# Patient Record
Sex: Female | Born: 1960
Health system: Southern US, Community
[De-identification: ages and names within clinical notes are randomized; demographics above are authoritative.]

## PROBLEM LIST (undated history)

## (undated) DIAGNOSIS — H269 Unspecified cataract: Secondary | ICD-10-CM

## (undated) DIAGNOSIS — F32A Depression, unspecified: Secondary | ICD-10-CM

## (undated) DIAGNOSIS — E079 Disorder of thyroid, unspecified: Secondary | ICD-10-CM

## (undated) DIAGNOSIS — E785 Hyperlipidemia, unspecified: Secondary | ICD-10-CM

## (undated) DIAGNOSIS — K589 Irritable bowel syndrome without diarrhea: Secondary | ICD-10-CM

## (undated) DIAGNOSIS — I10 Essential (primary) hypertension: Secondary | ICD-10-CM

## (undated) DIAGNOSIS — C801 Malignant (primary) neoplasm, unspecified: Secondary | ICD-10-CM

## (undated) DIAGNOSIS — F419 Anxiety disorder, unspecified: Secondary | ICD-10-CM

## (undated) HISTORY — DX: Irritable bowel syndrome, unspecified: K58.9

## (undated) HISTORY — DX: Malignant (primary) neoplasm, unspecified: C80.1

## (undated) HISTORY — DX: Unspecified cataract: H26.9

## (undated) HISTORY — DX: Anxiety disorder, unspecified: F41.9

## (undated) HISTORY — DX: Depression, unspecified: F32.A

## (undated) HISTORY — DX: Hyperlipidemia, unspecified: E78.5

## (undated) HISTORY — PX: POLYPECTOMY: SHX149

## (undated) HISTORY — DX: Disorder of thyroid, unspecified: E07.9

## (undated) HISTORY — DX: Essential (primary) hypertension: I10

---

## 1999-11-02 HISTORY — PX: ABDOMINAL HYSTERECTOMY: SHX81

## 2000-10-02 HISTORY — PX: INGUINAL HERNIA REPAIR: SUR1180

## 2001-11-03 ENCOUNTER — Encounter: Payer: Self-pay | Admitting: Internal Medicine

## 2001-11-03 ENCOUNTER — Encounter: Admission: RE | Admit: 2001-11-03 | Discharge: 2001-11-03 | Payer: Self-pay | Admitting: Internal Medicine

## 2001-11-03 ENCOUNTER — Encounter (INDEPENDENT_AMBULATORY_CARE_PROVIDER_SITE_OTHER): Payer: Self-pay | Admitting: *Deleted

## 2001-11-18 ENCOUNTER — Ambulatory Visit (HOSPITAL_COMMUNITY): Admission: RE | Admit: 2001-11-18 | Discharge: 2001-11-18 | Payer: Self-pay | Admitting: Obstetrics and Gynecology

## 2001-11-18 ENCOUNTER — Encounter: Payer: Self-pay | Admitting: Obstetrics and Gynecology

## 2002-07-14 ENCOUNTER — Encounter: Payer: Self-pay | Admitting: Internal Medicine

## 2002-07-14 ENCOUNTER — Encounter: Admission: RE | Admit: 2002-07-14 | Discharge: 2002-07-14 | Payer: Self-pay | Admitting: *Deleted

## 2002-08-31 ENCOUNTER — Other Ambulatory Visit: Admission: RE | Admit: 2002-08-31 | Discharge: 2002-08-31 | Payer: Self-pay | Admitting: Obstetrics and Gynecology

## 2002-09-28 ENCOUNTER — Encounter: Admission: RE | Admit: 2002-09-28 | Discharge: 2002-09-28 | Payer: Self-pay | Admitting: Internal Medicine

## 2002-09-28 ENCOUNTER — Encounter: Payer: Self-pay | Admitting: Internal Medicine

## 2002-11-02 ENCOUNTER — Encounter: Payer: Self-pay | Admitting: Obstetrics and Gynecology

## 2002-11-02 ENCOUNTER — Encounter: Admission: RE | Admit: 2002-11-02 | Discharge: 2002-11-02 | Payer: Self-pay | Admitting: Obstetrics and Gynecology

## 2002-12-13 ENCOUNTER — Encounter: Payer: Self-pay | Admitting: Obstetrics and Gynecology

## 2002-12-13 ENCOUNTER — Encounter: Admission: RE | Admit: 2002-12-13 | Discharge: 2002-12-13 | Payer: Self-pay | Admitting: Obstetrics and Gynecology

## 2003-08-10 ENCOUNTER — Ambulatory Visit (HOSPITAL_COMMUNITY): Admission: RE | Admit: 2003-08-10 | Discharge: 2003-08-10 | Payer: Self-pay | Admitting: Obstetrics and Gynecology

## 2003-08-10 ENCOUNTER — Encounter: Payer: Self-pay | Admitting: Obstetrics and Gynecology

## 2004-01-09 ENCOUNTER — Encounter: Admission: RE | Admit: 2004-01-09 | Discharge: 2004-01-09 | Payer: Self-pay | Admitting: Obstetrics and Gynecology

## 2005-01-16 ENCOUNTER — Encounter: Admission: RE | Admit: 2005-01-16 | Discharge: 2005-01-16 | Payer: Self-pay | Admitting: Obstetrics and Gynecology

## 2006-02-12 ENCOUNTER — Encounter: Admission: RE | Admit: 2006-02-12 | Discharge: 2006-02-12 | Payer: Self-pay | Admitting: Obstetrics and Gynecology

## 2007-03-11 ENCOUNTER — Encounter: Admission: RE | Admit: 2007-03-11 | Discharge: 2007-03-11 | Payer: Self-pay | Admitting: Obstetrics and Gynecology

## 2007-07-24 ENCOUNTER — Ambulatory Visit: Payer: Self-pay | Admitting: Internal Medicine

## 2007-08-10 ENCOUNTER — Ambulatory Visit: Payer: Self-pay | Admitting: Internal Medicine

## 2007-08-10 ENCOUNTER — Encounter: Payer: Self-pay | Admitting: Internal Medicine

## 2007-12-31 ENCOUNTER — Ambulatory Visit (HOSPITAL_COMMUNITY): Admission: RE | Admit: 2007-12-31 | Discharge: 2007-12-31 | Payer: Self-pay | Admitting: Chiropractic Medicine

## 2009-11-09 ENCOUNTER — Encounter (INDEPENDENT_AMBULATORY_CARE_PROVIDER_SITE_OTHER): Payer: Self-pay | Admitting: *Deleted

## 2009-12-18 ENCOUNTER — Ambulatory Visit: Payer: Self-pay | Admitting: Internal Medicine

## 2009-12-18 DIAGNOSIS — Z860100 Personal history of colon polyps, unspecified: Secondary | ICD-10-CM | POA: Insufficient documentation

## 2009-12-18 DIAGNOSIS — K5900 Constipation, unspecified: Secondary | ICD-10-CM | POA: Insufficient documentation

## 2009-12-19 LAB — CONVERTED CEMR LAB
AST: 17 units/L (ref 0–37)
Albumin: 4.2 g/dL (ref 3.5–5.2)
Alkaline Phosphatase: 48 units/L (ref 39–117)
Basophils Absolute: 0 10*3/uL (ref 0.0–0.1)
Bilirubin, Direct: 0.1 mg/dL (ref 0.0–0.3)
CO2: 28 meq/L (ref 19–32)
Calcium: 9.8 mg/dL (ref 8.4–10.5)
Eosinophils Relative: 2 % (ref 0.0–5.0)
GFR calc non Af Amer: 81.6 mL/min (ref >60)
Glucose, Bld: 62 mg/dL — ABNORMAL LOW (ref 70–99)
Lymphocytes Relative: 44.5 % (ref 12.0–46.0)
Monocytes Relative: 9.2 % (ref 3.0–12.0)
Neutrophils Relative %: 43.5 % (ref 43.0–77.0)
Platelets: 292 10*3/uL (ref 150.0–400.0)
Potassium: 4.8 meq/L (ref 3.5–5.1)
RDW: 12.2 % (ref 11.5–14.6)
Sodium: 145 meq/L (ref 135–145)
Total Protein: 6.9 g/dL (ref 6.0–8.3)
WBC: 4.5 10*3/uL (ref 4.5–10.5)

## 2010-09-03 ENCOUNTER — Encounter: Admission: RE | Admit: 2010-09-03 | Discharge: 2010-09-03 | Payer: Self-pay | Admitting: Obstetrics and Gynecology

## 2011-01-01 NOTE — Assessment & Plan Note (Signed)
Summary: constipation     History of Present Illness Visit Type: Initial Visit Primary GI MD: Yancey Flemings MD Primary Provider: none Chief Complaint: severe constipation History of Present Illness:   50 year old white female for history ofanxiety/depression, kidney stones, skin cancer, and adenomatous colon polyps. She presented today for evaluation of chronic constipation. Patient reports a many year history of constipation. She has tried increased fiber and water intake without results. Currently, she takes one Ducolax tablet twice weekly. This results in a good bowel movement without side effects. She is concerned about using laxatives regularly. She did undergo complete colonoscopy August 10, 2007 because of a family history of colon cancer in a parent at age 81. The examination was complete to the cecum with excellent preparation. A diminutive A. ascending colon polyp adenoma was removed. Follow up in 5 years recommended. The patient denies abdominal pain, rectal bleeding, weight loss.. She feels that she was having some of these difficulties with her bowels at the time of her index colonoscopy.. Her only new medication is Prozac. She does not notice worsening in her bowels since starting the medication.   GI Review of Systems    Reports acid reflux, belching, and  bloating.      Denies abdominal pain, chest pain, dysphagia with liquids, dysphagia with solids, heartburn, loss of appetite, nausea, vomiting, vomiting blood, weight loss, and  weight gain.      Reports change in bowel habits and  constipation.     Denies anal fissure, black tarry stools, diarrhea, diverticulosis, fecal incontinence, heme positive stool, hemorrhoids, irritable bowel syndrome, jaundice, light color stool, liver problems, rectal bleeding, and  rectal pain.    Current Medications (verified): 1)  Budeprion Sr 150 Mg Xr12h-Tab (Bupropion Hcl) .Marland Kitchen.. 1 Tablet By Mouth Once Daily 2)  Premarin 0.625 Mg Tabs (Estrogens  Conjugated) .Marland Kitchen.. 1 Tablet By Mouth Once Daily 3)  Alprazolam 0.5 Mg Tabs (Alprazolam) .Marland Kitchen.. 1 By Mouth As Needed Anxiety 4)  Fluoxetine Hcl 10 Mg Caps (Fluoxetine Hcl) .Marland Kitchen.. 1-2 Capsules By Mouth Once Daily  Allergies (verified): 1)  ! Codeine  Past History:  Past Medical History: Colon  Polyps-Tubular Adenoma Family History of Colon Cancer Inguinal Hernia Sleep Apnea Anxiety Disorder Depression Skin Cancer Kidney Stones  Past Surgical History: Reviewed history from 12/14/2009 and no changes required. Hysterectomy Lt. Inguinal Hernia Repair Skin Cancer Removal Laproscopy  Family History: Reviewed history and no changes required. Family History of Colon Cancer: Family History of Colon Polyps:  Social History: Reviewed history and no changes required. 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  Alcohol Use - no Daily Caffeine Use 2 per day Smoking Status:  quit  Review of Systems       anxiety, depression. All other systems reviewed and reported as negative  Vital Signs:  Patient profile:   50 year old female Height:      63 inches Weight:      115.4 pounds BMI:     20.52 Pulse rate:   84 / minute Pulse rhythm:   regular BP sitting:   118 / 78  (left arm)  Vitals Entered By: Milford Cage NCMA (December 18, 2009 9:15 AM)  Physical Exam  General:  Well developed, well nourished, no acute distress. Head:  Normocephalic and atraumatic. Eyes:  PERRLA, no icterus. Ears:  Normal auditory acuity. Nose:  No deformity, discharge,  or lesions. Mouth:  No deformity or lesions, dentition normal. Neck:  Supple; no masses  or thyromegaly. Lungs:  Clear throughout to auscultation. Heart:  Regular rate and rhythm; no murmurs, rubs,  or bruits. Abdomen:  Soft, nontender and nondistended. No masses, hepatosplenomegaly or hernias noted. Normal bowel sounds. Msk:  Symmetrical with no gross deformities. Normal posture. Pulses:  Normal pulses  noted. Extremities:  No clubbing, cyanosis, edema or deformities noted. Neurologic:  Alert and  oriented x4;  grossly normal neurologically. Skin:  Intact without significant lesions or rashes. Cervical Nodes:  No significant cervical adenopathy. Psych:  Alert and cooperative. Normal mood and affect.   Impression & Recommendations:  Problem # 1:  CONSTIPATION (ICD-564.00) Chronic constipation, likely functional. Essentially normal colonoscopy in 2008 as described. Satisfactory results with one Ducolax tablet twice weekly.  Plan: #1. Screening laboratories including TSH, calcium, and CBC to assess for other possible causes for constipation #2. Okay to use Ducolax for constipation #3. Follow up as needed for this problem. Resume general medical care with Dr. Senaida Ores  Problem # 2:  COLONIC POLYPS, HX OF (ICD-V12.72) history of diminutive adenoma September 2008. Due for followup colonoscopy around September 2013. Keep this anniversary date  Problem # 3:  FAMILY HX COLON CANCER (ICD-V16.0) parent at age 64  Other Orders: TLB-CBC Platelet - w/Differential (85025-CBCD) TLB-BMP (Basic Metabolic Panel-BMET) (80048-METABOL) TLB-Hepatic/Liver Function Pnl (80076-HEPATIC) TLB-TSH (Thyroid Stimulating Hormone) (84443-TSH)  Patient Instructions: 1)  labs ordered for patient to have drawn today. 2)  The medication list was reviewed and reconciled.  All changed / newly prescribed medications were explained.  A complete medication list was provided to the patient / caregiver. 3)  Copy: Dr. Huel Cote

## 2011-01-01 NOTE — Procedures (Signed)
Summary: Colonoscopy   Colonoscopy  Procedure date:  08/10/2007  Findings:      Location:  [facility].  Tubular Adenoma Colon Polyps  Comments:      Repeat colonoscopy in 5 years.   Procedures Next Due Date:    Colonoscopy: 08/2012 Patient Name: Johnnae, Impastato MRN:  Procedure Procedures: Colonoscopy CPT: 62952.    with polypectomy. CPT: A3573898.  Personnel: Endoscopist: Wilhemina Bonito. Marina Goodell, MD.  Referred By: Huel Cote, MD.  Exam Location: Exam performed in Outpatient Clinic. Outpatient  Patient Consent: Procedure, Alternatives, Risks and Benefits discussed, consent obtained, from patient. Consent was obtained by the RN.  Indications  Increased Risk Screening: For family history of colorectal neoplasia, in  parent age at onset: 31. grandparent Family History of Polyps.  History  Current Medications: Patient is not currently taking Coumadin.  Pre-Exam Physical: Performed Aug 10, 2007. Cardio-pulmonary exam, Rectal exam, HEENT exam , Abdominal exam, Mental status exam WNL.  Comments: Pt. history reviewed/updated, physical exam performed prior to initiation of sedation?yes Exam Exam: Extent of exam reached: Cecum, extent intended: Cecum.  The cecum was identified by appendiceal orifice and IC valve. Patient position: on left side. Time to Cecum: 00:08:30. Time for Withdrawl: 00:09:41. Colon retroflexion performed. Images taken. ASA Classification: I. Tolerance: good.  Monitoring: Pulse and BP monitoring, Oximetry used. Supplemental O2 given.  Colon Prep Used Miralax for colon prep. Prep results: excellent.  Sedation Meds: Patient assessed and found to be appropriate for moderate (conscious) sedation. Fentanyl 100 mcg. given IV. Versed 12 given IV.  Findings POLYP: Ascending Colon, Maximum size: 1 mm. Procedure:  snare without cautery, removed, retrieved, Polyp sent to pathology. ICD9: Colon Polyps: 211.3.  NORMAL EXAM: Cecum to Rectum.    Assessment  Diagnoses: 211.3: Colon Polyps.   Events  Unplanned Interventions: No intervention was required.  Unplanned Events: There were no complications. Plans Disposition: After procedure patient sent to recovery. After recovery patient sent home.  Scheduling/Referral: Colonoscopy, to Wilhemina Bonito. Marina Goodell, MD, in 5 years,    This report was created from the original endoscopy report, which was reviewed and signed by the above listed endoscopist.   cc:  Huel Cote, MD      The Patient

## 2011-09-23 ENCOUNTER — Other Ambulatory Visit: Payer: Self-pay | Admitting: Obstetrics and Gynecology

## 2011-09-23 DIAGNOSIS — Z1231 Encounter for screening mammogram for malignant neoplasm of breast: Secondary | ICD-10-CM

## 2011-10-03 ENCOUNTER — Ambulatory Visit
Admission: RE | Admit: 2011-10-03 | Discharge: 2011-10-03 | Disposition: A | Discharge status: Home / Self Care | Payer: PRIVATE HEALTH INSURANCE | Source: Ambulatory Visit | Attending: Obstetrics and Gynecology | Admitting: Obstetrics and Gynecology

## 2011-10-03 DIAGNOSIS — Z1231 Encounter for screening mammogram for malignant neoplasm of breast: Secondary | ICD-10-CM

## 2012-01-31 HISTORY — PX: DENTAL SURGERY: SHX609

## 2012-09-03 ENCOUNTER — Other Ambulatory Visit: Payer: Self-pay | Admitting: Obstetrics and Gynecology

## 2012-09-03 DIAGNOSIS — Z1231 Encounter for screening mammogram for malignant neoplasm of breast: Secondary | ICD-10-CM

## 2012-09-04 ENCOUNTER — Encounter: Payer: Self-pay | Admitting: Internal Medicine

## 2012-10-09 ENCOUNTER — Ambulatory Visit
Admission: RE | Admit: 2012-10-09 | Discharge: 2012-10-09 | Disposition: A | Discharge status: Home / Self Care | Payer: PRIVATE HEALTH INSURANCE | Source: Ambulatory Visit | Attending: Obstetrics and Gynecology | Admitting: Obstetrics and Gynecology

## 2012-10-09 DIAGNOSIS — Z1231 Encounter for screening mammogram for malignant neoplasm of breast: Secondary | ICD-10-CM

## 2012-11-27 ENCOUNTER — Encounter: Payer: Self-pay | Admitting: Internal Medicine

## 2012-12-25 ENCOUNTER — Ambulatory Visit (AMBULATORY_SURGERY_CENTER): Payer: PRIVATE HEALTH INSURANCE | Admitting: *Deleted

## 2012-12-25 VITALS — Ht 63.0 in | Wt 120.0 lb

## 2012-12-25 DIAGNOSIS — Z1211 Encounter for screening for malignant neoplasm of colon: Secondary | ICD-10-CM

## 2012-12-25 MED ORDER — MOVIPREP 100 G PO SOLR: ORAL | Status: DC

## 2013-01-08 ENCOUNTER — Ambulatory Visit (AMBULATORY_SURGERY_CENTER): Payer: PRIVATE HEALTH INSURANCE | Admitting: Internal Medicine

## 2013-01-08 ENCOUNTER — Encounter: Payer: Self-pay | Admitting: Internal Medicine

## 2013-01-08 VITALS — BP 114/98 | HR 89 | Temp 96.9°F | Resp 15 | Ht 63.0 in | Wt 120.0 lb

## 2013-01-08 DIAGNOSIS — Z86010 Personal history of colonic polyps: Secondary | ICD-10-CM

## 2013-01-08 DIAGNOSIS — D126 Benign neoplasm of colon, unspecified: Secondary | ICD-10-CM

## 2013-01-08 DIAGNOSIS — Z1211 Encounter for screening for malignant neoplasm of colon: Secondary | ICD-10-CM

## 2013-01-08 DIAGNOSIS — Z800 Family history of malignant neoplasm of digestive organs: Secondary | ICD-10-CM

## 2013-01-08 HISTORY — PX: COLONOSCOPY: SHX174

## 2013-01-08 MED ORDER — SODIUM CHLORIDE 0.9 % IV SOLN: 500.0000 mL | INTRAVENOUS | Status: DC

## 2013-01-08 NOTE — Progress Notes (Signed)
Patient did not experience any of the following events: a burn prior to discharge; a fall within the facility; wrong site/side/patient/procedure/implant event; or a hospital transfer or hospital admission upon discharge from the facility. (G8907) Patient did not have preoperative order for IV antibiotic SSI prophylaxis. (G8918)  

## 2013-01-08 NOTE — Progress Notes (Signed)
Called to room to assist during endoscopic procedure.  Patient ID and intended procedure confirmed with present staff. Received instructions for my participation in the procedure from the performing physician.Called to room to assist during endoscopic procedure.  Patient ID and intended procedure confirmed with present staff. Received instructions for my participation in the procedure from the performing physician. 

## 2013-01-08 NOTE — Op Note (Signed)
[facility] [address] [place], [address]   COLONOSCOPY PROCEDURE REPORT  PATIENT: Brittney Palmer  MR#: [id number] BIRTHDATE: 03/01/1961 , 51  yrs. old GENDER: Female ENDOSCOPIST: Roxy Cedar, MD REFERRED XB:[id number] Program Recall PROCEDURE DATE:  01/08/2013 PROCEDURE:   Colonoscopy with snare polypectomy    x 4 ASA CLASS:   Class I INDICATIONS:Patient's personal history of adenomatous colon polyps (08-2007 w/ TA), Patient's immediate family history of colon cancer, and Patient's family history of colon cancer, distant relatives. MEDICATIONS: MAC sedation, administered by CRNA and propofol (Diprivan) 200mg  IV  DESCRIPTION OF PROCEDURE:   After the risks benefits and alternatives of the procedure were thoroughly explained, informed consent was obtained.  A digital rectal exam revealed no abnormalities of the rectum.   The LB [id number]  endoscope was introduced through the anus and advanced to the cecum, which was identified by both the appendix and ileocecal valve. No adverse events experienced.   The quality of the prep was excellent, using MoviPrep  The instrument was then slowly withdrawn as the colon was fully examined.      COLON FINDINGS: Four polyps measuring 5 mm in size were found in the ascending colon, transverse colon, and sigmoid colon (2).  A polypectomy was performed with a cold snare.  The resection was complete and the polyp tissue was completely retrieved.   The colon mucosa was otherwise normal.  Retroflexed views revealed no abnormalities. The time to cecum=5 minutes 07 seconds.  Withdrawal time=10 minutes 51 seconds.  The scope was withdrawn and the procedure completed. COMPLICATIONS: There were no complications.  ENDOSCOPIC IMPRESSION: 1.   Four polyps measuring 5 mm in size were found in the ascending colon, transverse colon, and sigmoid colon; polypectomy was performed with a cold snare 2.   The colon mucosa  was otherwise normal  RECOMMENDATIONS: 1. Repeat Colonoscopy in 3 years.   eSigned:  Roxy Cedar, MD 01/08/2013 8:46 AM   cc: Huel Cote, MD, Maurice Small, MD, and The Patient   PATIENT NAME:  Brittney Palmer, Brittney Palmer MR#: [id number]

## 2013-01-08 NOTE — Patient Instructions (Signed)
YOU HAD AN ENDOSCOPIC PROCEDURE TODAY AT THE [facility]: Refer to the procedure report that was given to you for any specific questions about what was found during the examination.  If the procedure report does not answer your questions, please call your gastroenterologist to clarify.  If you requested that your care partner not be given the details of your procedure findings, then the procedure report has been included in a sealed envelope for you to review at your convenience later.  YOU SHOULD EXPECT: Some feelings of bloating in the abdomen. Passage of more gas than usual.  Walking can help get rid of the air that was put into your GI tract during the procedure and reduce the bloating. If you had a lower endoscopy (such as a colonoscopy or flexible sigmoidoscopy) you may notice spotting of blood in your stool or on the toilet paper. If you underwent a bowel prep for your procedure, then you may not have a normal bowel movement for a few days.  DIET: Your first meal following the procedure should be a light meal and then it is ok to progress to your normal diet.  A half-sandwich or bowl of soup is an example of a good first meal.  Heavy or fried foods are harder to digest and may make you feel nauseous or bloated.  Likewise meals heavy in dairy and vegetables can cause extra gas to form and this can also increase the bloating.  Drink plenty of fluids but you should avoid alcoholic beverages for 24 hours.  ACTIVITY: Your care partner should take you home directly after the procedure.  You should plan to take it easy, moving slowly for the rest of the day.  You can resume normal activity the day after the procedure however you should NOT DRIVE or use heavy machinery for 24 hours (because of the sedation medicines used during the test).    SYMPTOMS TO REPORT IMMEDIATELY: A gastroenterologist can be reached at any hour.  During normal business hours, 8:30 AM to 5:00 PM Monday through Friday,  call [phone].  After hours and on weekends, please call the GI answering service at [phone] who will take a message and have the physician on call contact you.   Following lower endoscopy (colonoscopy or flexible sigmoidoscopy):  Excessive amounts of blood in the stool  Significant tenderness or worsening of abdominal pains  Swelling of the abdomen that is new, acute  Fever of 100F or higher    FOLLOW UP: If any biopsies were taken you will be contacted by phone or by letter within the next 1-3 weeks.  Call your gastroenterologist if you have not heard about the biopsies in 3 weeks.  Our staff will call the home number listed on your records the next business day following your procedure to check on you and address any questions or concerns that you may have at that time regarding the information given to you following your procedure. This is a courtesy call and so if there is no answer at the home number and we have not heard from you through the emergency physician on call, we will assume that you have returned to your regular daily activities without incident.  SIGNATURES/CONFIDENTIALITY: You and/or your care partner have signed paperwork which will be entered into your electronic medical record.  These signatures attest to the fact that that the information above on your After Visit Summary has been reviewed and is understood.  Full responsibility of the confidentiality   of this discharge information lies with you and/or your care-partner.     

## 2013-01-08 NOTE — Progress Notes (Addendum)
Patient complained of abdominal discomfort on admission to recovery.  Patient very emotional and crying when speaking with husband. Patient position changed frequently and levsin given per standing orders. Relief obtained and large amounts of flatus passed. No longer emotional and is comfortable with pain level less than 4.

## 2013-01-11 ENCOUNTER — Telehealth: Payer: Self-pay | Admitting: *Deleted

## 2013-01-11 NOTE — Telephone Encounter (Signed)
NO ANSWER, MESSAGE LEFT FOR THE PATIENT. 

## 2013-01-12 ENCOUNTER — Encounter: Payer: Self-pay | Admitting: Internal Medicine

## 2013-10-05 ENCOUNTER — Other Ambulatory Visit: Payer: Self-pay

## 2013-10-05 DIAGNOSIS — Z1231 Encounter for screening mammogram for malignant neoplasm of breast: Secondary | ICD-10-CM

## 2013-10-05 DIAGNOSIS — Z780 Asymptomatic menopausal state: Secondary | ICD-10-CM

## 2013-11-05 ENCOUNTER — Other Ambulatory Visit: Payer: Self-pay | Admitting: Obstetrics and Gynecology

## 2013-11-05 DIAGNOSIS — Z780 Asymptomatic menopausal state: Secondary | ICD-10-CM

## 2013-11-10 ENCOUNTER — Ambulatory Visit: Payer: PRIVATE HEALTH INSURANCE

## 2013-11-10 ENCOUNTER — Other Ambulatory Visit: Payer: PRIVATE HEALTH INSURANCE

## 2013-12-03 ENCOUNTER — Other Ambulatory Visit: Payer: Self-pay | Admitting: Family Medicine

## 2013-12-14 ENCOUNTER — Ambulatory Visit: Payer: PRIVATE HEALTH INSURANCE

## 2013-12-14 ENCOUNTER — Other Ambulatory Visit: Payer: PRIVATE HEALTH INSURANCE

## 2013-12-17 ENCOUNTER — Other Ambulatory Visit: Payer: Self-pay | Admitting: Family Medicine

## 2013-12-17 ENCOUNTER — Ambulatory Visit
Admission: RE | Admit: 2013-12-17 | Discharge: 2013-12-17 | Disposition: A | Discharge status: Home / Self Care | Payer: PRIVATE HEALTH INSURANCE | Source: Ambulatory Visit | Attending: Family Medicine | Admitting: Family Medicine

## 2013-12-17 DIAGNOSIS — T1490XA Injury, unspecified, initial encounter: Secondary | ICD-10-CM

## 2014-01-25 ENCOUNTER — Ambulatory Visit
Admission: RE | Admit: 2014-01-25 | Discharge: 2014-01-25 | Disposition: A | Discharge status: Home / Self Care | Payer: PRIVATE HEALTH INSURANCE | Source: Ambulatory Visit | Attending: Obstetrics and Gynecology | Admitting: Obstetrics and Gynecology

## 2014-01-25 ENCOUNTER — Ambulatory Visit
Admission: RE | Admit: 2014-01-25 | Discharge: 2014-01-25 | Disposition: A | Discharge status: Home / Self Care | Payer: PRIVATE HEALTH INSURANCE | Source: Ambulatory Visit

## 2014-01-25 DIAGNOSIS — Z780 Asymptomatic menopausal state: Secondary | ICD-10-CM

## 2014-01-25 DIAGNOSIS — Z1231 Encounter for screening mammogram for malignant neoplasm of breast: Secondary | ICD-10-CM

## 2016-02-06 ENCOUNTER — Encounter: Payer: Self-pay | Admitting: Internal Medicine

## 2016-03-21 ENCOUNTER — Encounter: Payer: Self-pay | Admitting: Family Medicine

## 2016-09-06 DIAGNOSIS — F411 Generalized anxiety disorder: Secondary | ICD-10-CM | POA: Diagnosis not present

## 2017-07-28 DIAGNOSIS — F411 Generalized anxiety disorder: Secondary | ICD-10-CM | POA: Diagnosis not present

## 2017-07-28 DIAGNOSIS — I10 Essential (primary) hypertension: Secondary | ICD-10-CM | POA: Diagnosis not present

## 2017-07-28 DIAGNOSIS — Z85828 Personal history of other malignant neoplasm of skin: Secondary | ICD-10-CM | POA: Diagnosis not present

## 2017-07-28 DIAGNOSIS — Z87891 Personal history of nicotine dependence: Secondary | ICD-10-CM | POA: Diagnosis not present

## 2017-09-16 DIAGNOSIS — R350 Frequency of micturition: Secondary | ICD-10-CM | POA: Diagnosis not present

## 2017-09-16 DIAGNOSIS — Z6824 Body mass index (BMI) 24.0-24.9, adult: Secondary | ICD-10-CM | POA: Diagnosis not present

## 2017-09-16 DIAGNOSIS — Z1331 Encounter for screening for depression: Secondary | ICD-10-CM | POA: Diagnosis not present

## 2017-09-16 DIAGNOSIS — N390 Urinary tract infection, site not specified: Secondary | ICD-10-CM | POA: Diagnosis not present

## 2017-09-29 DIAGNOSIS — R399 Unspecified symptoms and signs involving the genitourinary system: Secondary | ICD-10-CM | POA: Diagnosis not present

## 2017-09-29 DIAGNOSIS — N390 Urinary tract infection, site not specified: Secondary | ICD-10-CM | POA: Diagnosis not present

## 2017-09-29 DIAGNOSIS — R8290 Unspecified abnormal findings in urine: Secondary | ICD-10-CM | POA: Diagnosis not present

## 2017-09-30 DIAGNOSIS — R8290 Unspecified abnormal findings in urine: Secondary | ICD-10-CM | POA: Diagnosis not present

## 2017-10-10 DIAGNOSIS — Z113 Encounter for screening for infections with a predominantly sexual mode of transmission: Secondary | ICD-10-CM | POA: Diagnosis not present

## 2017-10-10 DIAGNOSIS — N390 Urinary tract infection, site not specified: Secondary | ICD-10-CM | POA: Diagnosis not present

## 2017-10-10 DIAGNOSIS — R8290 Unspecified abnormal findings in urine: Secondary | ICD-10-CM | POA: Diagnosis not present

## 2018-03-02 DIAGNOSIS — I10 Essential (primary) hypertension: Secondary | ICD-10-CM | POA: Diagnosis not present

## 2018-03-02 DIAGNOSIS — F39 Unspecified mood [affective] disorder: Secondary | ICD-10-CM | POA: Diagnosis not present

## 2018-03-02 DIAGNOSIS — N951 Menopausal and female climacteric states: Secondary | ICD-10-CM | POA: Diagnosis not present

## 2018-04-13 DIAGNOSIS — I10 Essential (primary) hypertension: Secondary | ICD-10-CM | POA: Diagnosis not present

## 2018-07-15 DIAGNOSIS — K5909 Other constipation: Secondary | ICD-10-CM | POA: Diagnosis not present

## 2018-07-15 DIAGNOSIS — Z800 Family history of malignant neoplasm of digestive organs: Secondary | ICD-10-CM | POA: Diagnosis not present

## 2018-09-15 ENCOUNTER — Encounter: Payer: Self-pay | Admitting: Internal Medicine

## 2018-09-15 ENCOUNTER — Ambulatory Visit: Payer: BLUE CROSS/BLUE SHIELD | Admitting: Internal Medicine

## 2018-09-15 VITALS — BP 110/78 | HR 103 | Ht 63.0 in | Wt 141.0 lb

## 2018-09-15 DIAGNOSIS — K5900 Constipation, unspecified: Secondary | ICD-10-CM | POA: Diagnosis not present

## 2018-09-15 DIAGNOSIS — Z800 Family history of malignant neoplasm of digestive organs: Secondary | ICD-10-CM | POA: Diagnosis not present

## 2018-09-15 DIAGNOSIS — Z86010 Personal history of colonic polyps: Secondary | ICD-10-CM

## 2018-09-15 NOTE — Patient Instructions (Signed)
Please call back when you are ready to schedule a colonoscopy 

## 2018-09-15 NOTE — Progress Notes (Signed)
HISTORY OF PRESENT ILLNESS:  Brittney Palmer is a 57 y.o. female with a family history of colon cancer and a history of multiple adenomatous colon polyps for which she is undergone prior colonoscopy in this office.  Patient also has a history of chronic constipation for which she saw her PCP August 2019.  I have reviewed that office note.  MiraLAX 1 dose daily was recommended.  The patient has been compliant and reports marked improvement in her constipation.  She underwent colonoscopy in 2008 with tubular adenoma.  Her last colonoscopy was February 2014.  For adenomatous colon polyps for which follow-up in 3 years was recommended.  She did receive a recall letter but did not act on that letter recommending follow-up in 2017.  Her GI review of systems at this time is otherwise negative.  Review of outside data and x-rays does not show any relevant abnormalities.  REVIEW OF SYSTEMS:  All non-GI ROS negative unless otherwise stated in the HPI except for anxiety  Past Medical History:  Diagnosis Date  . Anxiety   . Cancer (HCC)    squamous cell  . Hypertension   . IBS (irritable bowel syndrome)     Past Surgical History:  Procedure Laterality Date  . ABDOMINAL HYSTERECTOMY  11/1999  . DENTAL SURGERY  01/2012   implant  . INGUINAL HERNIA REPAIR  10/2000   left    Social History Brittney Palmer  reports that she has quit smoking. She has never used smokeless tobacco. She reports that she drinks about 2.0 standard drinks of alcohol per week. She reports that she does not use drugs.  family history includes Cirrhosis in her paternal uncle; Colon cancer in her father and paternal grandmother.  Allergies  Allergen Reactions  . Codeine Nausea And Vomiting       PHYSICAL EXAMINATION: Vital signs: BP 110/78   Pulse (!) 103   Ht 5\' 3"  (1.6 m)   Wt 141 lb (64 kg)   BMI 24.98 kg/m   Constitutional: generally well-appearing, no acute distress Psychiatric: alert and oriented x3,  cooperative Eyes: extraocular movements intact, anicteric, conjunctiva pink Mouth: oral pharynx moist, no lesions Neck: supple no lymphadenopathy Cardiovascular: heart regular rate and rhythm, no murmur Lungs: clear to auscultation bilaterally Abdomen: soft, nontender, nondistended, no obvious ascites, no peritoneal signs, normal bowel sounds, no organomegaly Rectal: Deferred until colonoscopy Extremities: no clubbing, cyanosis, or lower extremity edema bilaterally Skin: no lesions on visible extremities Neuro: No focal deficits.  Cranial nerves intact  ASSESSMENT:  1.  Functional constipation.  Improved with MiraLAX 2.  Personal history of multiple adenomatous colon polyps.  Overdue for surveillance 3.  Family history of colon cancer   PLAN:  1.  Continue MiraLAX.  We discussed the proper way to titrate to achieve desired effect 2.  Schedule colonoscopy for surveillance.The nature of the procedure, as well as the risks, benefits, and alternatives were carefully and thoroughly reviewed with the patient. Ample time for discussion and questions allowed. The patient understood, was satisfied, and agreed to proceed.  The patient states that she wishes not to schedule at this time but will contact the office in the future after reviewing the finances associated with moving forward.  She does understand the risk of colon cancer without complying with surveillance guidelines.  She can see a previsit nurse directly and does not need to schedule an office visit with me.  She understands.

## 2018-11-16 DIAGNOSIS — N951 Menopausal and female climacteric states: Secondary | ICD-10-CM | POA: Diagnosis not present

## 2018-11-16 DIAGNOSIS — F39 Unspecified mood [affective] disorder: Secondary | ICD-10-CM | POA: Diagnosis not present

## 2018-11-16 DIAGNOSIS — I10 Essential (primary) hypertension: Secondary | ICD-10-CM | POA: Diagnosis not present

## 2018-11-16 DIAGNOSIS — Z0000 Encounter for general adult medical examination without abnormal findings: Secondary | ICD-10-CM | POA: Diagnosis not present

## 2018-12-28 DIAGNOSIS — M9903 Segmental and somatic dysfunction of lumbar region: Secondary | ICD-10-CM | POA: Diagnosis not present

## 2018-12-28 DIAGNOSIS — M9905 Segmental and somatic dysfunction of pelvic region: Secondary | ICD-10-CM | POA: Diagnosis not present

## 2018-12-28 DIAGNOSIS — M25551 Pain in right hip: Secondary | ICD-10-CM | POA: Diagnosis not present

## 2018-12-28 DIAGNOSIS — M5441 Lumbago with sciatica, right side: Secondary | ICD-10-CM | POA: Diagnosis not present

## 2018-12-29 DIAGNOSIS — Z713 Dietary counseling and surveillance: Secondary | ICD-10-CM | POA: Diagnosis not present

## 2019-01-04 DIAGNOSIS — M9905 Segmental and somatic dysfunction of pelvic region: Secondary | ICD-10-CM | POA: Diagnosis not present

## 2019-01-04 DIAGNOSIS — M5441 Lumbago with sciatica, right side: Secondary | ICD-10-CM | POA: Diagnosis not present

## 2019-01-04 DIAGNOSIS — M9903 Segmental and somatic dysfunction of lumbar region: Secondary | ICD-10-CM | POA: Diagnosis not present

## 2019-01-04 DIAGNOSIS — M25551 Pain in right hip: Secondary | ICD-10-CM | POA: Diagnosis not present

## 2019-01-07 DIAGNOSIS — M9903 Segmental and somatic dysfunction of lumbar region: Secondary | ICD-10-CM | POA: Diagnosis not present

## 2019-01-07 DIAGNOSIS — M9905 Segmental and somatic dysfunction of pelvic region: Secondary | ICD-10-CM | POA: Diagnosis not present

## 2019-01-07 DIAGNOSIS — M25551 Pain in right hip: Secondary | ICD-10-CM | POA: Diagnosis not present

## 2019-01-07 DIAGNOSIS — M5441 Lumbago with sciatica, right side: Secondary | ICD-10-CM | POA: Diagnosis not present

## 2019-01-11 DIAGNOSIS — M5441 Lumbago with sciatica, right side: Secondary | ICD-10-CM | POA: Diagnosis not present

## 2019-01-11 DIAGNOSIS — M9903 Segmental and somatic dysfunction of lumbar region: Secondary | ICD-10-CM | POA: Diagnosis not present

## 2019-01-11 DIAGNOSIS — M9905 Segmental and somatic dysfunction of pelvic region: Secondary | ICD-10-CM | POA: Diagnosis not present

## 2019-01-11 DIAGNOSIS — M25551 Pain in right hip: Secondary | ICD-10-CM | POA: Diagnosis not present

## 2019-02-25 DIAGNOSIS — Z713 Dietary counseling and surveillance: Secondary | ICD-10-CM | POA: Diagnosis not present

## 2019-07-19 DIAGNOSIS — F341 Dysthymic disorder: Secondary | ICD-10-CM | POA: Diagnosis not present

## 2019-07-19 DIAGNOSIS — I10 Essential (primary) hypertension: Secondary | ICD-10-CM | POA: Diagnosis not present

## 2019-07-23 DIAGNOSIS — Z1159 Encounter for screening for other viral diseases: Secondary | ICD-10-CM | POA: Diagnosis not present

## 2019-07-23 DIAGNOSIS — Z114 Encounter for screening for human immunodeficiency virus [HIV]: Secondary | ICD-10-CM | POA: Diagnosis not present

## 2019-07-23 DIAGNOSIS — I10 Essential (primary) hypertension: Secondary | ICD-10-CM | POA: Diagnosis not present

## 2019-07-29 DIAGNOSIS — I10 Essential (primary) hypertension: Secondary | ICD-10-CM | POA: Diagnosis not present

## 2019-07-29 DIAGNOSIS — F341 Dysthymic disorder: Secondary | ICD-10-CM | POA: Diagnosis not present

## 2019-07-29 DIAGNOSIS — E7800 Pure hypercholesterolemia, unspecified: Secondary | ICD-10-CM | POA: Diagnosis not present

## 2019-08-03 ENCOUNTER — Telehealth: Payer: Self-pay | Admitting: Orthopedic Surgery

## 2019-08-03 NOTE — Telephone Encounter (Signed)
Message in pt's chart this is the wife that was calling for her husband. The husband is the pt that needed information not this pt.

## 2019-08-03 NOTE — Telephone Encounter (Signed)
Patient's husband called stating that the patient would like to talk to Dr. Sharol Given in regards to some things that she needs done.  CB#[phone].  Thank you.

## 2019-09-07 DIAGNOSIS — Z780 Asymptomatic menopausal state: Secondary | ICD-10-CM | POA: Diagnosis not present

## 2019-09-07 DIAGNOSIS — Z79818 Long term (current) use of other agents affecting estrogen receptors and estrogen levels: Secondary | ICD-10-CM | POA: Diagnosis not present

## 2019-09-07 DIAGNOSIS — N6489 Other specified disorders of breast: Secondary | ICD-10-CM | POA: Diagnosis not present

## 2019-09-07 DIAGNOSIS — Z1231 Encounter for screening mammogram for malignant neoplasm of breast: Secondary | ICD-10-CM | POA: Diagnosis not present

## 2019-09-09 DIAGNOSIS — L570 Actinic keratosis: Secondary | ICD-10-CM | POA: Diagnosis not present

## 2019-09-09 DIAGNOSIS — D485 Neoplasm of uncertain behavior of skin: Secondary | ICD-10-CM | POA: Diagnosis not present

## 2019-09-09 DIAGNOSIS — L820 Inflamed seborrheic keratosis: Secondary | ICD-10-CM | POA: Diagnosis not present

## 2019-10-08 DIAGNOSIS — F341 Dysthymic disorder: Secondary | ICD-10-CM | POA: Diagnosis not present

## 2019-10-08 DIAGNOSIS — R928 Other abnormal and inconclusive findings on diagnostic imaging of breast: Secondary | ICD-10-CM | POA: Diagnosis not present

## 2019-10-08 DIAGNOSIS — I10 Essential (primary) hypertension: Secondary | ICD-10-CM | POA: Diagnosis not present

## 2019-11-02 DIAGNOSIS — E7800 Pure hypercholesterolemia, unspecified: Secondary | ICD-10-CM | POA: Diagnosis not present

## 2019-11-12 DIAGNOSIS — F411 Generalized anxiety disorder: Secondary | ICD-10-CM | POA: Diagnosis not present

## 2019-11-12 DIAGNOSIS — F341 Dysthymic disorder: Secondary | ICD-10-CM | POA: Diagnosis not present

## 2019-11-12 DIAGNOSIS — N809 Endometriosis, unspecified: Secondary | ICD-10-CM | POA: Diagnosis not present

## 2019-11-12 DIAGNOSIS — I10 Essential (primary) hypertension: Secondary | ICD-10-CM | POA: Diagnosis not present

## 2020-01-31 DIAGNOSIS — E7800 Pure hypercholesterolemia, unspecified: Secondary | ICD-10-CM | POA: Diagnosis not present

## 2020-01-31 DIAGNOSIS — I10 Essential (primary) hypertension: Secondary | ICD-10-CM | POA: Diagnosis not present

## 2020-02-07 DIAGNOSIS — E7800 Pure hypercholesterolemia, unspecified: Secondary | ICD-10-CM | POA: Diagnosis not present

## 2020-02-07 DIAGNOSIS — I10 Essential (primary) hypertension: Secondary | ICD-10-CM | POA: Diagnosis not present

## 2020-02-07 DIAGNOSIS — F411 Generalized anxiety disorder: Secondary | ICD-10-CM | POA: Diagnosis not present

## 2020-09-04 DIAGNOSIS — I10 Essential (primary) hypertension: Secondary | ICD-10-CM | POA: Diagnosis not present

## 2020-09-04 DIAGNOSIS — E785 Hyperlipidemia, unspecified: Secondary | ICD-10-CM | POA: Diagnosis not present

## 2020-09-08 DIAGNOSIS — I10 Essential (primary) hypertension: Secondary | ICD-10-CM | POA: Diagnosis not present

## 2020-09-08 DIAGNOSIS — E785 Hyperlipidemia, unspecified: Secondary | ICD-10-CM | POA: Diagnosis not present

## 2020-09-08 DIAGNOSIS — K5909 Other constipation: Secondary | ICD-10-CM | POA: Diagnosis not present

## 2020-09-08 DIAGNOSIS — F32A Depression, unspecified: Secondary | ICD-10-CM | POA: Diagnosis not present

## 2020-11-08 ENCOUNTER — Encounter: Payer: Self-pay | Admitting: Internal Medicine

## 2020-12-26 ENCOUNTER — Ambulatory Visit (AMBULATORY_SURGERY_CENTER): Payer: Self-pay | Admitting: *Deleted

## 2020-12-26 ENCOUNTER — Other Ambulatory Visit: Payer: Self-pay

## 2020-12-26 VITALS — Ht 63.0 in | Wt 127.0 lb

## 2020-12-26 DIAGNOSIS — Z800 Family history of malignant neoplasm of digestive organs: Secondary | ICD-10-CM

## 2020-12-26 DIAGNOSIS — Z86010 Personal history of colonic polyps: Secondary | ICD-10-CM

## 2020-12-26 DIAGNOSIS — Z01818 Encounter for other preprocedural examination: Secondary | ICD-10-CM

## 2020-12-26 MED ORDER — PLENVU 140 G PO SOLR: 1.0000 | Freq: Once | ORAL | 0 refills | Status: AC

## 2020-12-26 NOTE — Progress Notes (Signed)
Patient is here in-person for PV. Patient denies any allergies to eggs or soy. Patient denies any problems with anesthesia/sedation. Patient denies any oxygen use at home. Patient denies taking any diet/weight loss medications or blood thinners. Patient is not being treated for MRSA or C-diff. Patient is aware of our care-partner policy and [id number] safety protocol.   COVID-19 test 2/3 at 330 pm,pt aware. Prep Prescription coupon given to the patient. Patient takes Miralax daily and has BM daily, she will continue while drinking the prep.

## 2021-01-04 ENCOUNTER — Other Ambulatory Visit: Payer: Self-pay | Admitting: Internal Medicine

## 2021-01-05 LAB — SARS CORONAVIRUS 2 (TAT 6-24 HRS): SARS Coronavirus 2: NEGATIVE

## 2021-01-09 ENCOUNTER — Other Ambulatory Visit: Payer: Self-pay

## 2021-01-09 ENCOUNTER — Ambulatory Visit (AMBULATORY_SURGERY_CENTER): Payer: Managed Care, Other (non HMO) | Admitting: Internal Medicine

## 2021-01-09 ENCOUNTER — Encounter: Payer: Self-pay | Admitting: Internal Medicine

## 2021-01-09 VITALS — BP 119/68 | HR 102 | Temp 97.1°F | Resp 19 | Ht 63.0 in | Wt 127.0 lb

## 2021-01-09 DIAGNOSIS — D125 Benign neoplasm of sigmoid colon: Secondary | ICD-10-CM | POA: Diagnosis not present

## 2021-01-09 DIAGNOSIS — Z800 Family history of malignant neoplasm of digestive organs: Secondary | ICD-10-CM

## 2021-01-09 DIAGNOSIS — D124 Benign neoplasm of descending colon: Secondary | ICD-10-CM

## 2021-01-09 DIAGNOSIS — D123 Benign neoplasm of transverse colon: Secondary | ICD-10-CM | POA: Diagnosis not present

## 2021-01-09 DIAGNOSIS — Z86010 Personal history of colonic polyps: Secondary | ICD-10-CM

## 2021-01-09 HISTORY — PX: COLONOSCOPY: SHX174

## 2021-01-09 MED ORDER — SODIUM CHLORIDE 0.9 % IV SOLN: 500.0000 mL | Freq: Once | INTRAVENOUS | Status: DC

## 2021-01-09 NOTE — Progress Notes (Signed)
Called to room to assist during endoscopic procedure.  Patient ID and intended procedure confirmed with present staff. Received instructions for my participation in the procedure from the performing physician.  

## 2021-01-09 NOTE — Progress Notes (Signed)
Report to PACU, RN, vss, BBS= Clear.  

## 2021-01-09 NOTE — Op Note (Signed)
[facility] Patient Name: Brittney Palmer Procedure Date: 01/09/2021 7:31 AM MRN: [id number] Endoscopist: Docia Chuck. Henrene Pastor , MD Age: 60 Referring MD:  Date of Birth: 1961-08-12 Gender: Female Account #: [id number] Procedure:                Colonoscopy with cold snare polypectomy x 4 Indications:              High risk colon cancer surveillance: Personal                            history of multiple (3 or more) adenomas. Previous                            examinations 2008, 2014. Family history of colon                            cancer in both first and second-degree relatives Medicines:                Monitored Anesthesia Care Procedure:                Pre-Anesthesia Assessment:                           - Prior to the procedure, a History and Physical                            was performed, and patient medications and                            allergies were reviewed. The patient's tolerance of                            previous anesthesia was also reviewed. The risks                            and benefits of the procedure and the sedation                            options and risks were discussed with the patient.                            All questions were answered, and informed consent                            was obtained. Prior Anticoagulants: The patient has                            taken no previous anticoagulant or antiplatelet                            agents. After reviewing the risks and benefits, the                            patient was deemed in satisfactory condition to  undergo the procedure.                           After obtaining informed consent, the colonoscope                            was passed under direct vision. Throughout the                            procedure, the patient's blood pressure, pulse, and                            oxygen saturations were monitored continuously. The                             Olympus [id number]) [id number] was introduced                            through the anus and advanced to the the cecum,                            identified by appendiceal orifice and ileocecal                            valve. The ileocecal valve, appendiceal orifice,                            and rectum were photographed. The quality of the                            bowel preparation was excellent. The colonoscopy                            was performed without difficulty. The patient                            tolerated the procedure well. The bowel preparation                            used was Plenvu via split dose instruction. Scope In: 9:01:35 AM Scope Out: 9:23:26 AM Scope Withdrawal Time: 0 hours 16 minutes 6 seconds  Total Procedure Duration: 0 hours 21 minutes 51 seconds  Findings:                 Four polyps were found in the sigmoid colon,                            descending colon and transverse colon. The polyps                            were 3 to 5 mm in size.                           The exam was otherwise without abnormality on  direct and retroflexion views. Complications:            No immediate complications. Estimated blood loss:                            None. Estimated Blood Loss:     Estimated blood loss: none. Impression:               - Four 3 to 5 mm polyps in the sigmoid colon, in                            the descending colon and in the transverse colon.                           - The examination was otherwise normal on direct                            and retroflexion views.                           - No specimens collected. Recommendation:           - Repeat colonoscopy in 3 years for surveillance.                           - Patient has a contact number available for                            emergencies. The signs and symptoms of potential                            delayed complications were discussed with  the                            patient. Return to normal activities tomorrow.                            Written discharge instructions were provided to the                            patient.                           - Resume previous diet.                           - Continue present medications.                           - Await pathology results. Docia Chuck. Henrene Pastor, MD 01/09/2021 9:41:56 AM This report has been signed electronically.

## 2021-01-09 NOTE — Progress Notes (Signed)
Pt's states no medical or surgical changes since previsit or office visit.  VS CW  

## 2021-01-09 NOTE — Patient Instructions (Signed)
Resume previous diet Continue current medications Await pathology  YOU HAD AN ENDOSCOPIC PROCEDURE TODAY AT [facility]:   Refer to the procedure report that was given to you for any specific questions about what was found during the examination.  If the procedure report does not answer your questions, please call your gastroenterologist to clarify.  If you requested that your care partner not be given the details of your procedure findings, then the procedure report has been included in a sealed envelope for you to review at your convenience later.  YOU SHOULD EXPECT: Some feelings of bloating in the abdomen. Passage of more gas than usual.  Walking can help get rid of the air that was put into your GI tract during the procedure and reduce the bloating. If you had a lower endoscopy (such as a colonoscopy or flexible sigmoidoscopy) you may notice spotting of blood in your stool or on the toilet paper. If you underwent a bowel prep for your procedure, you may not have a normal bowel movement for a few days.  Please Note:  You might notice some irritation and congestion in your nose or some drainage.  This is from the oxygen used during your procedure.  There is no need for concern and it should clear up in a day or so.  SYMPTOMS TO REPORT IMMEDIATELY:   Following lower endoscopy (colonoscopy or flexible sigmoidoscopy):  Excessive amounts of blood in the stool  Significant tenderness or worsening of abdominal pains  Swelling of the abdomen that is new, acute  Fever of 100F or higher  For urgent or emergent issues, a gastroenterologist can be reached at any hour by calling [phone]. Do not use MyChart messaging for urgent concerns.   DIET:  We do recommend a small meal at first, but then you may proceed to your regular diet.  Drink plenty of fluids but you should avoid alcoholic beverages for 24 hours.  ACTIVITY:  You should plan to take it easy for the rest of today and  you should NOT DRIVE or use heavy machinery until tomorrow (because of the sedation medicines used during the test).    FOLLOW UP: Our staff will call the number listed on your records 48-72 hours following your procedure to check on you and address any questions or concerns that you may have regarding the information given to you following your procedure. If we do not reach you, we will leave a message.  We will attempt to reach you two times.  During this call, we will ask if you have developed any symptoms of COVID 19. If you develop any symptoms (ie: fever, flu-like symptoms, shortness of breath, cough etc.) before then, please call [phone].  If you test positive for Covid 19 in the 2 weeks post procedure, please call and report this information to [place].    If any biopsies were taken you will be contacted by phone or by letter within the next 1-3 weeks.  Please call us at [phone] if you have not heard about the biopsies in 3 weeks.   SIGNATURES/CONFIDENTIALITY: You and/or your care partner have signed paperwork which will be entered into your electronic medical record.  These signatures attest to the fact that that the information above on your After Visit Summary has been reviewed and is understood.  Full responsibility of the confidentiality of this discharge information lies with you and/or your care-partner.

## 2021-01-11 ENCOUNTER — Telehealth: Payer: Self-pay

## 2021-01-11 NOTE — Telephone Encounter (Signed)
  Follow up Call-  Call back number 01/09/2021  Post procedure Call Back phone  # [phone]  Permission to leave phone message Yes  Some recent data might be hidden     Patient questions:  Do you have a fever, pain , or abdominal swelling? No. Pain Score  0 *  Have you tolerated food without any problems? Yes.    Have you been able to return to your normal activities? Yes.    Do you have any questions about your discharge instructions: Diet   No. Medications  No. Follow up visit  No.  Do you have questions or concerns about your Care? No.  Actions: * If pain score is 4 or above: No action needed, pain <4. 1. Have you developed a fever since your procedure? no  2.   Have you had an respiratory symptoms (SOB or cough) since your procedure? no  3.   Have you tested positive for COVID 19 since your procedure no  4.   Have you had any family members/close contacts diagnosed with the COVID 19 since your procedure?  no   If yes to any of these questions please route to Joylene John, RN and Joella Prince, RN

## 2021-01-16 ENCOUNTER — Encounter: Payer: Self-pay | Admitting: Internal Medicine

## 2021-03-14 ENCOUNTER — Other Ambulatory Visit: Payer: Self-pay | Admitting: Family Medicine

## 2021-03-14 DIAGNOSIS — Z1231 Encounter for screening mammogram for malignant neoplasm of breast: Secondary | ICD-10-CM

## 2021-03-14 DIAGNOSIS — R928 Other abnormal and inconclusive findings on diagnostic imaging of breast: Secondary | ICD-10-CM

## 2021-03-15 ENCOUNTER — Ambulatory Visit: Payer: Managed Care, Other (non HMO)

## 2021-04-10 ENCOUNTER — Ambulatory Visit
Admission: RE | Admit: 2021-04-10 | Discharge: 2021-04-10 | Disposition: A | Discharge status: Home / Self Care | Payer: Managed Care, Other (non HMO) | Source: Ambulatory Visit | Attending: Family Medicine | Admitting: Family Medicine

## 2021-04-10 ENCOUNTER — Other Ambulatory Visit: Payer: Self-pay

## 2021-04-10 ENCOUNTER — Other Ambulatory Visit: Payer: Self-pay | Admitting: Family Medicine

## 2021-04-10 DIAGNOSIS — R928 Other abnormal and inconclusive findings on diagnostic imaging of breast: Secondary | ICD-10-CM

## 2022-03-26 ENCOUNTER — Other Ambulatory Visit: Payer: Self-pay | Admitting: Family Medicine

## 2022-03-26 DIAGNOSIS — Z1231 Encounter for screening mammogram for malignant neoplasm of breast: Secondary | ICD-10-CM

## 2022-04-11 ENCOUNTER — Ambulatory Visit
Admission: RE | Admit: 2022-04-11 | Discharge: 2022-04-11 | Disposition: A | Discharge status: Home / Self Care | Payer: Managed Care, Other (non HMO) | Source: Ambulatory Visit | Attending: Family Medicine | Admitting: Family Medicine

## 2022-04-11 DIAGNOSIS — Z1231 Encounter for screening mammogram for malignant neoplasm of breast: Secondary | ICD-10-CM

## 2022-12-17 ENCOUNTER — Other Ambulatory Visit: Payer: Self-pay | Admitting: Family Medicine

## 2022-12-17 DIAGNOSIS — Z1231 Encounter for screening mammogram for malignant neoplasm of breast: Secondary | ICD-10-CM

## 2023-04-14 ENCOUNTER — Ambulatory Visit
Admission: RE | Admit: 2023-04-14 | Discharge: 2023-04-14 | Disposition: A | Discharge status: Home / Self Care | Payer: Managed Care, Other (non HMO) | Source: Ambulatory Visit | Attending: Family Medicine | Admitting: Family Medicine

## 2023-04-14 DIAGNOSIS — Z1231 Encounter for screening mammogram for malignant neoplasm of breast: Secondary | ICD-10-CM

## 2024-03-01 ENCOUNTER — Other Ambulatory Visit: Payer: Self-pay | Admitting: Family Medicine

## 2024-03-01 DIAGNOSIS — Z1231 Encounter for screening mammogram for malignant neoplasm of breast: Secondary | ICD-10-CM

## 2024-03-03 ENCOUNTER — Encounter: Payer: Self-pay | Admitting: Internal Medicine

## 2024-04-08 ENCOUNTER — Other Ambulatory Visit: Payer: Self-pay | Admitting: Medical Genetics

## 2024-04-08 ENCOUNTER — Encounter (HOSPITAL_COMMUNITY): Payer: Self-pay

## 2024-04-15 ENCOUNTER — Ambulatory Visit
Admission: RE | Admit: 2024-04-15 | Discharge: 2024-04-15 | Disposition: A | Discharge status: Home / Self Care | Source: Ambulatory Visit | Attending: Family Medicine | Admitting: Family Medicine

## 2024-04-15 DIAGNOSIS — Z1231 Encounter for screening mammogram for malignant neoplasm of breast: Secondary | ICD-10-CM

## 2024-04-18 IMAGING — MG MM DIGITAL SCREENING BILAT W/ TOMO AND CAD
8 series · 9 of 24 positions shown · non-contrast
Comparison: Previous exam(s).

CLINICAL DATA: Screening.

EXAM:
DIGITAL SCREENING BILATERAL MAMMOGRAM WITH TOMOSYNTHESIS AND CAD
TECHNIQUE: Bilateral screening digital craniocaudal and mediolateral oblique
mammograms were obtained. Bilateral screening digital breast
tomosynthesis was performed. The images were evaluated with
computer-aided detection.

[R MLO synth-2D]
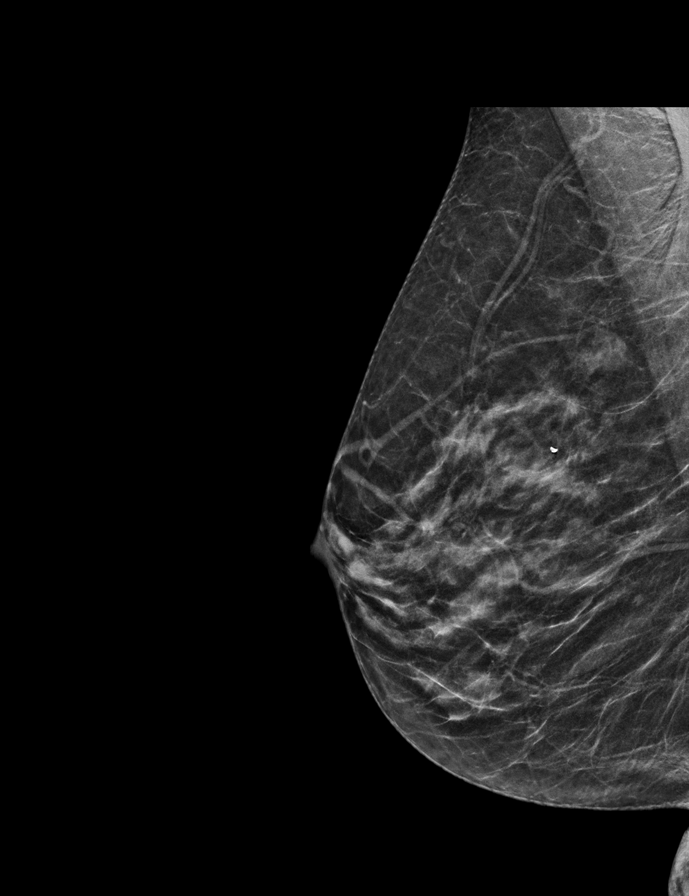

[L CC synth-2D]
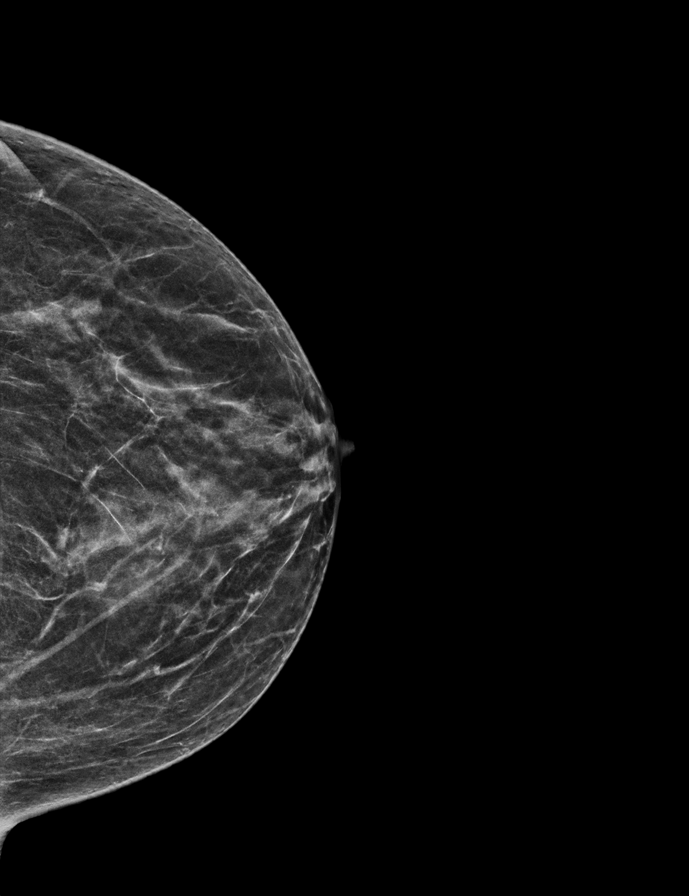

[L MLO synth-2D]
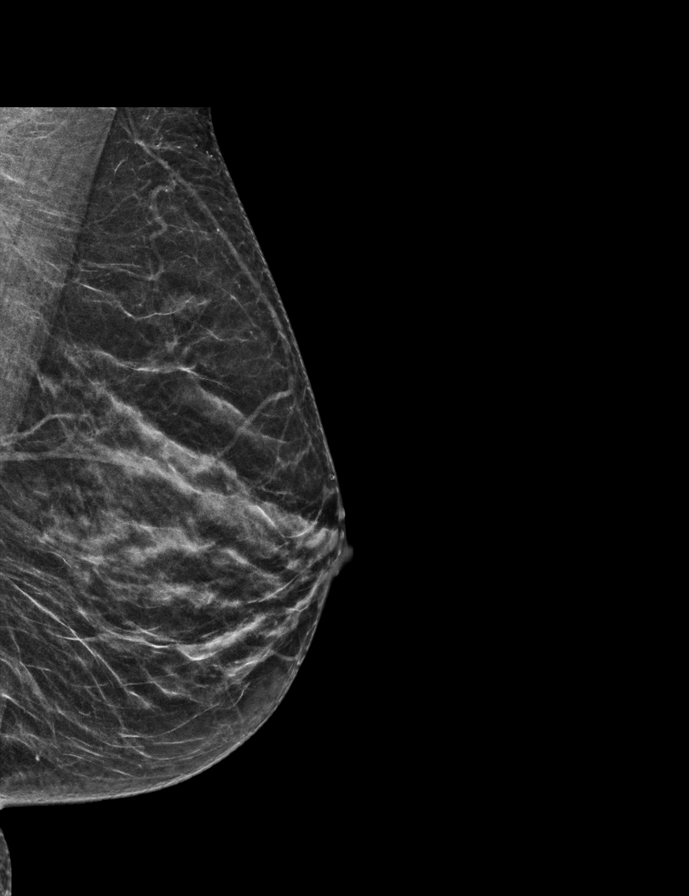

[R CC synth-2D]
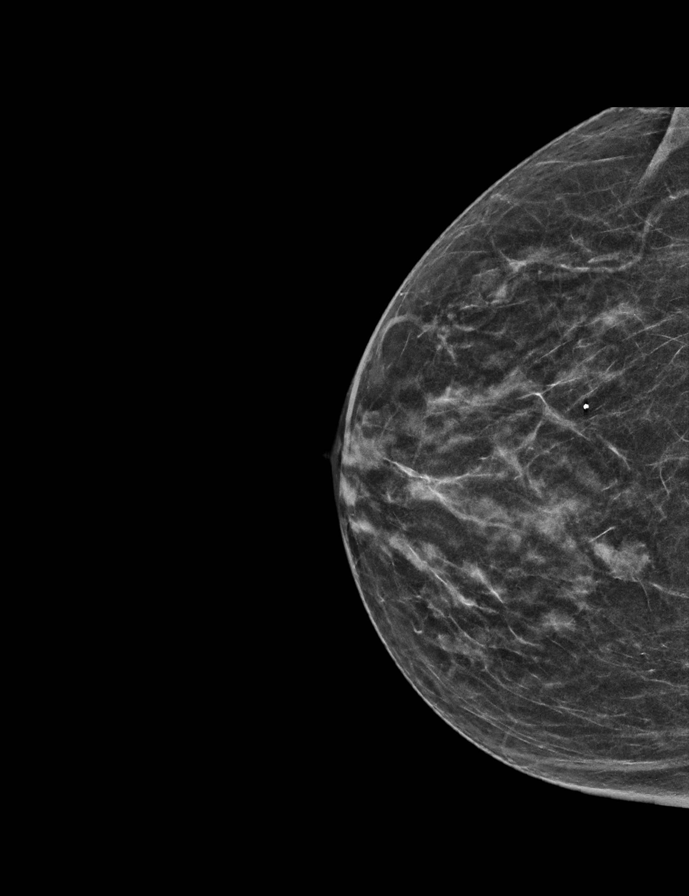

[R CC tomo · 2 of 44 frames shown]
[frame 15/44]
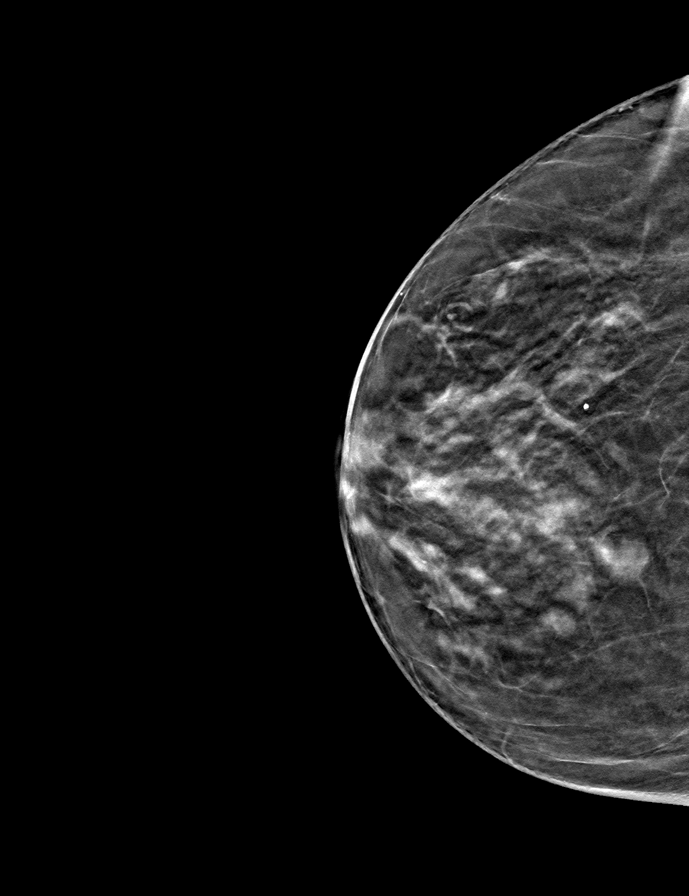
[frame 23/44]
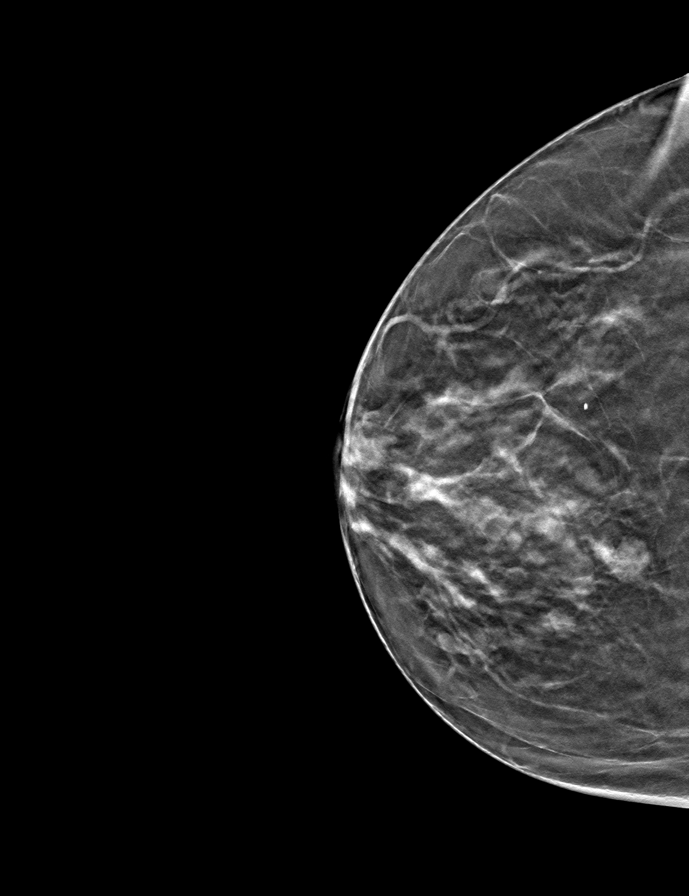

[R MLO tomo · tomo slice 21/42.0]
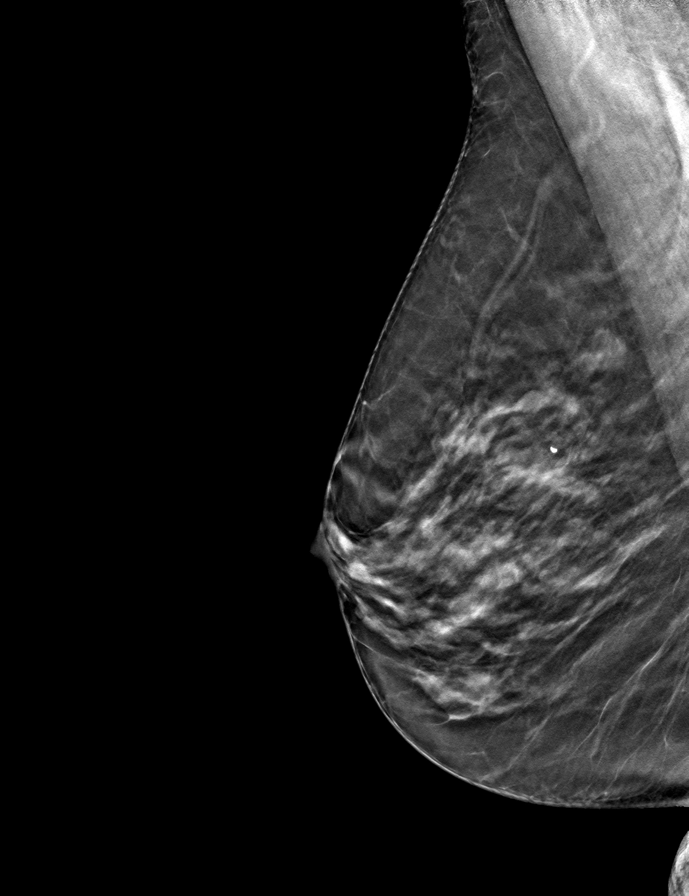

[L MLO tomo · tomo slice 21/41.0]
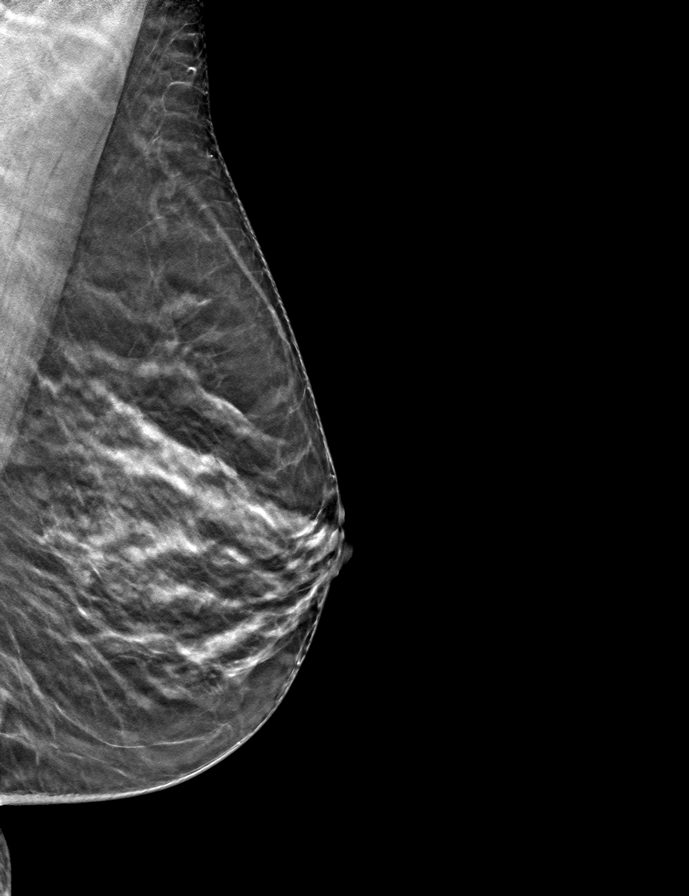

[L CC tomo · tomo slice 21/40.0]
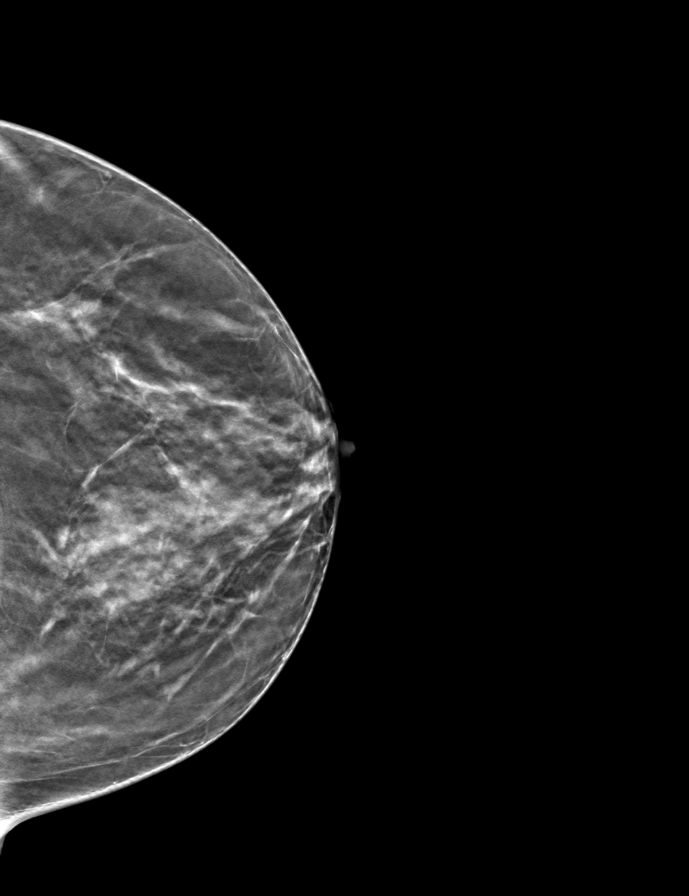

[9 of 24 positions shown; findings below may reference images not displayed]

ACR Breast Density Category c: The breast tissue is heterogeneously
dense, which may obscure small masses.
FINDINGS: There are no findings suspicious for malignancy.
IMPRESSION: No mammographic evidence of malignancy. A result letter of this
screening mammogram will be mailed directly to the patient.

RECOMMENDATION:
Screening mammogram in one year. (Code:Q3-W-BC3)

BI-RADS CATEGORY  1: Negative.

## 2024-05-26 ENCOUNTER — Other Ambulatory Visit (HOSPITAL_COMMUNITY)
Admission: RE | Admit: 2024-05-26 | Discharge: 2024-05-26 | Disposition: A | Discharge status: Home / Self Care | Payer: Self-pay | Source: Ambulatory Visit | Attending: Medical Genetics | Admitting: Medical Genetics

## 2024-06-06 LAB — GENECONNECT MOLECULAR SCREEN: Genetic Analysis Overall Interpretation: NEGATIVE

## 2024-09-15 ENCOUNTER — Encounter: Payer: Self-pay | Admitting: Internal Medicine

## 2024-10-06 ENCOUNTER — Ambulatory Visit (AMBULATORY_SURGERY_CENTER)

## 2024-10-06 VITALS — Ht 63.0 in | Wt 132.0 lb

## 2024-10-06 DIAGNOSIS — Z860100 Personal history of colon polyps, unspecified: Secondary | ICD-10-CM

## 2024-10-06 DIAGNOSIS — Z800 Family history of malignant neoplasm of digestive organs: Secondary | ICD-10-CM

## 2024-10-06 MED ORDER — NA SULFATE-K SULFATE-MG SULF 17.5-3.13-1.6 GM/177ML PO SOLN: 1.0000 | Freq: Once | ORAL | 0 refills | Status: AC

## 2024-10-06 NOTE — Progress Notes (Signed)

## 2024-10-18 ENCOUNTER — Encounter: Payer: Self-pay | Admitting: Internal Medicine

## 2024-10-18 ENCOUNTER — Telehealth: Payer: Self-pay | Admitting: Internal Medicine

## 2024-10-18 NOTE — Telephone Encounter (Signed)
 Noted. This patient had a 3-year recall (last colonoscopy February 2022) due to history of multiple adenomas, last colonoscopy with 4 adenomatous polyps, and strong family history of colon cancer. The 3-year colonoscopy recall recommendation was in strict keeping with the guidelines. Dr. Abran

## 2024-10-18 NOTE — Telephone Encounter (Signed)
 Good afternoon Dr. Abran,    I received a call from this patient stating that she needed to cancel her procedure due to her insurance company denying her approval for this colonoscopy procedure that was scheduled for November the 19 th. Please advise.    Thank you.

## 2024-10-20 ENCOUNTER — Encounter: Admitting: Internal Medicine
